# Patient Record
Sex: Male | Born: 1975 | Race: White | Hispanic: No | Marital: Married | Smoking: Never smoker
Health system: Southern US, Community
[De-identification: ages and names within clinical notes are randomized; demographics above are authoritative.]

## PROBLEM LIST (undated history)

## (undated) DIAGNOSIS — R42 Dizziness and giddiness: Secondary | ICD-10-CM

## (undated) HISTORY — DX: Dizziness and giddiness: R42

---

## 2015-11-02 ENCOUNTER — Ambulatory Visit (INDEPENDENT_AMBULATORY_CARE_PROVIDER_SITE_OTHER): Payer: BC Managed Care – PPO | Admitting: Family Medicine

## 2015-11-02 VITALS — BP 126/80 | HR 101 | Temp 98.3°F | Resp 18 | Ht 69.0 in | Wt 223.4 lb

## 2015-11-02 DIAGNOSIS — R42 Dizziness and giddiness: Secondary | ICD-10-CM

## 2015-11-02 DIAGNOSIS — R112 Nausea with vomiting, unspecified: Secondary | ICD-10-CM

## 2015-11-02 DIAGNOSIS — H9203 Otalgia, bilateral: Secondary | ICD-10-CM | POA: Diagnosis not present

## 2015-11-02 DIAGNOSIS — B349 Viral infection, unspecified: Secondary | ICD-10-CM

## 2015-11-02 DIAGNOSIS — D72829 Elevated white blood cell count, unspecified: Secondary | ICD-10-CM

## 2015-11-02 LAB — POCT CBC
GRANULOCYTE PERCENT: 79.4 % (ref 37–80)
HEMATOCRIT: 43 % — AB (ref 43.5–53.7)
HEMOGLOBIN: 15 g/dL (ref 14.1–18.1)
Lymph, poc: 2 (ref 0.6–3.4)
MCH, POC: 29.5 pg (ref 27–31.2)
MCHC: 34.9 g/dL (ref 31.8–35.4)
MCV: 84.6 fL (ref 80–97)
MID (cbc): 0.7 (ref 0–0.9)
MPV: 6.6 fL (ref 0–99.8)
PLATELET COUNT, POC: 247 10*3/uL (ref 142–424)
POC GRANULOCYTE: 10.5 — AB (ref 2–6.9)
POC LYMPH PERCENT: 15 %L (ref 10–50)
POC MID %: 5.6 %M (ref 0–12)
RBC: 5.09 M/uL (ref 4.69–6.13)
RDW, POC: 13.6 %
WBC: 13.2 10*3/uL — AB (ref 4.6–10.2)

## 2015-11-02 MED ORDER — DIAZEPAM 2 MG PO TABS: ORAL_TABLET | ORAL | Status: AC

## 2015-11-02 MED ORDER — ONDANSETRON 4 MG PO TBDP
4.0000 mg | ORAL_TABLET | Freq: Once | ORAL | Status: AC
  Administered 2015-11-02: 4 mg via ORAL

## 2015-11-02 MED ORDER — ONDANSETRON 4 MG PO TBDP: ORAL_TABLET | ORAL | Status: AC

## 2015-11-02 MED ORDER — AMOXICILLIN 875 MG PO TABS: ORAL_TABLET | ORAL | Status: AC

## 2015-11-02 NOTE — Progress Notes (Signed)
Patient ID: Don Lindsey, male    DOB: 07/31/1976  Age: 39 y.o. MRN: [id number]  Chief Complaint  Patient presents with  . Sore Throat    vomiting  . Cough  . Ear Pain    Subjective:   Patient is here with vertigo. He has an attack about every 6 years she said. Several weeks ago he had an attack and just treat it with his old motion exercises. He was doing okay. 3 days ago he got a flulike illness with fever and body aches. He had some respiratory symptoms. He is doing better from that. However today he was hit midday with vertigo which has been very bad. He feels like if he moves his head up or down at all he gets extremely dizzy. The room goes around. It causes problems for him to trying over in bed. He has been having vomiting. His ears feel full like they're going to explode. He gets some popping sensations at times. He denies the above noted sore throat, says he just feels achy. Wife and son accompanied him here today.  Current allergies, medications, problem list, past/family and social histories reviewed.  Objective:  BP 126/80 mmHg  Pulse 101  Temp(Src) 98.3 F (36.8 C) (Oral)  Resp 18  Ht 5\' 9"  (1.753 m)  Wt 223 lb 6.4 oz (101.334 kg)  BMI 32.98 kg/m2  SpO2 98%  Looks like he feels very bad. Very pale. TMs are mildly erythematous bilaterally but no landmark loss. His eyes PERRLA. Fundi benign. EOMs intact. No obvious nystagmus. He holds his head very rigid however. Neck is not rigid. Chest clear. Heart regular without murmurs. Abdomen nontender.  Assessment & Plan:   Assessment: 1. Otalgia of both ears   2. Vertigo   3. Non-intractable vomiting with nausea, unspecified vomiting type   4. Viral syndrome   5. Leukocytosis       Plan:  Orders Placed This Encounter  Procedures  . POCT CBC    Meds ordered this encounter  Medications  . ondansetron (ZOFRAN-ODT) disintegrating tablet 4 mg    Sig:   . amoxicillin (AMOXIL) 875 MG tablet    Sig: Take 1 twice daily for  ears    Dispense:  20 tablet    Refill:  0  . diazepam (VALIUM) 2 MG tablet    Sig: Take 1 every 6 hours as needed for dizziness    Dispense:  12 tablet    Refill:  0  . ondansetron (ZOFRAN ODT) 4 MG disintegrating tablet    Sig: Dissolve 1 under the tongue every 6 hours when needed for nausea    Dispense:  10 tablet    Refill:  0    Results for orders placed or performed in visit on 11/02/15  POCT CBC  Result Value Ref Range   WBC 13.2 (A) 4.6 - 10.2 K/uL   Lymph, poc 2.0 0.6 - 3.4   POC LYMPH PERCENT 15.0 10 - 50 %L   MID (cbc) 0.7 0 - 0.9   POC MID % 5.6 0 - 12 %M   POC Granulocyte 10.5 (A) 2 - 6.9   Granulocyte percent 79.4 37 - 80 %G   RBC 5.09 4.69 - 6.13 M/uL   Hemoglobin 15.0 14.1 - 18.1 g/dL   HCT, POC 46.943.0 (A) 62.943.5 - 53.7 %   MCV 84.6 80 - 97 fL   MCH, POC 29.5 27 - 31.2 pg   MCHC 34.9 31.8 - 35.4 g/dL   RDW,  POC 13.6 %   Platelet Count, POC 247 142 - 424 K/uL   MPV 6.6 0 - 99.8 fL        Patient Instructions  Take the diazepam 2 mg 1 every 6 hours as needed for dizziness  Take the ondansetron 4 mg 1 pill dissolved under the tongue every 6 hours as needed for nausea or vomiting  Take Tylenol 1000 mg 3 times daily or ibuprofen 800 mg 3 times daily as needed for pain  Take amoxicillin 825 mg 1 twice daily early ear infections  If acutely worse go to the emergency room.   If persisting symptoms or not improving over the next couple of days return as needed.     No Follow-up on file.   Farrell Pantaleo, MD 11/02/2015

## 2015-11-02 NOTE — Patient Instructions (Addendum)
Take the diazepam 2 mg 1 every 6 hours as needed for dizziness  Take the ondansetron 4 mg 1 pill dissolved under the tongue every 6 hours as needed for nausea or vomiting  Take Tylenol 1000 mg 3 times daily or ibuprofen 800 mg 3 times daily as needed for pain  Take amoxicillin 825 mg 1 twice daily early ear infections  If acutely worse go to the emergency room.   If persisting symptoms or not improving over the next couple of days return as needed.

## 2016-08-14 ENCOUNTER — Other Ambulatory Visit: Payer: Self-pay | Admitting: Otolaryngology

## 2016-08-15 ENCOUNTER — Other Ambulatory Visit: Payer: Self-pay | Admitting: Otolaryngology

## 2016-08-15 DIAGNOSIS — IMO0001 Reserved for inherently not codable concepts without codable children: Secondary | ICD-10-CM

## 2016-08-15 DIAGNOSIS — H918X2 Other specified hearing loss, left ear: Secondary | ICD-10-CM

## 2016-08-24 ENCOUNTER — Ambulatory Visit
Admission: RE | Admit: 2016-08-24 | Discharge: 2016-08-24 | Disposition: A | Discharge status: Home / Self Care | Payer: BC Managed Care – PPO | Source: Ambulatory Visit | Attending: Otolaryngology | Admitting: Otolaryngology

## 2016-08-24 DIAGNOSIS — H918X2 Other specified hearing loss, left ear: Secondary | ICD-10-CM

## 2016-08-24 DIAGNOSIS — IMO0001 Reserved for inherently not codable concepts without codable children: Secondary | ICD-10-CM

## 2016-08-24 MED ORDER — GADOBENATE DIMEGLUMINE 529 MG/ML IV SOLN
20.0000 mL | Freq: Once | INTRAVENOUS | Status: AC | PRN
  Administered 2016-08-24: 20 mL via INTRAVENOUS

## 2017-11-11 IMAGING — MR MR HEAD WO/W CM
10 of 11 series · 32 of 48 positions shown · IV contrast (multihance)
Comparison: None.

CLINICAL DATA: 40-year-old male with 6 months of left side hearing
loss and intermittent tinnitus. Intermittent nausea and headaches.
No known injury. Initial encounter.

EXAM:
MRI HEAD WITHOUT AND WITH CONTRAST
TECHNIQUE: Multiplanar, multiecho pulse sequences of the brain and surrounding
structures were obtained without and with intravenous contrast.
CONTRAST:  20mL MULTIHANCE GADOBENATE DIMEGLUMINE 529 MG/ML IV SOLN

[Series 2: T1 · sagittal · 5.0mm · 0.47mm/px · 2 of 19 slices shown (1 of 4)]
[im 1/19]
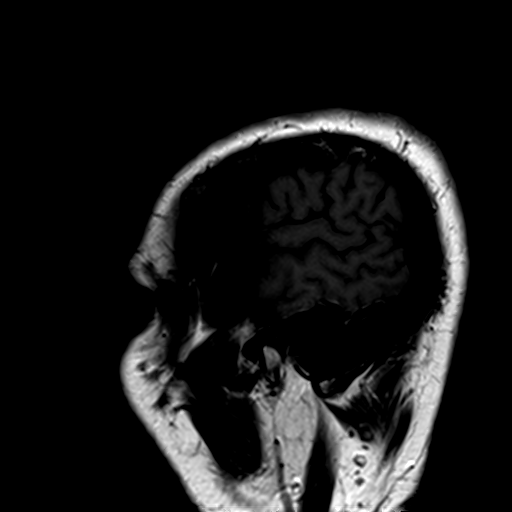
[im 19/19]
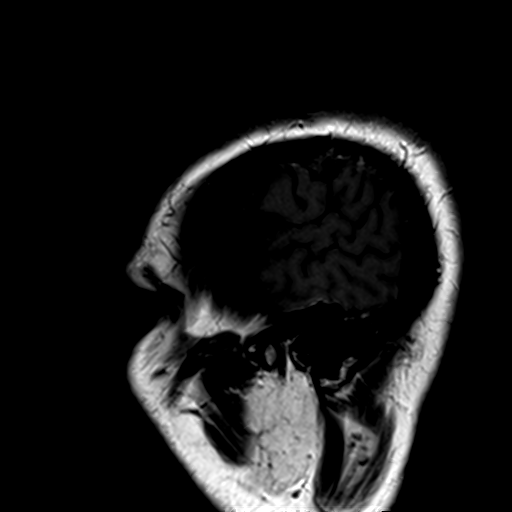

[Series 3: ep2d_diff_(id)_trace · axial · 3.0mm · 1.80mm/px · z∈[-22,+137]mm · 8 of 105 slices shown]
[im 1/105]
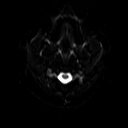
[im 18/105]
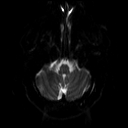
[im 35/105]
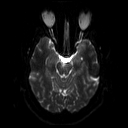
[im 44/105]
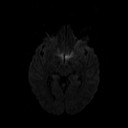
[im 61/105]
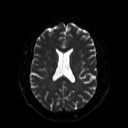
[im 70/105]
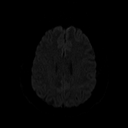
[im 87/105]
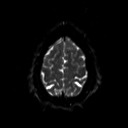
[im 105/105]
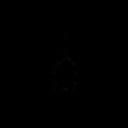

[Series 4: ep2d_diff_(id)_trace_adc · axial · 3.0mm · 1.80mm/px · z∈[-22,+137]mm · 6 of 54 slices shown]
[im 1/54]
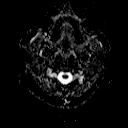
[im 11/54]
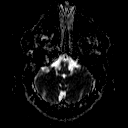
[im 22/54]
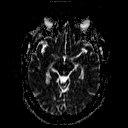
[im 32/54]
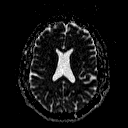
[im 43/54]
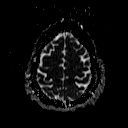
[im 54/54]
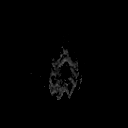

[Series 5: T2 · axial · 5.0mm · 0.45mm/px · z∈[-21,+135]mm · 3 of 26 slices shown]
[im 1/26]
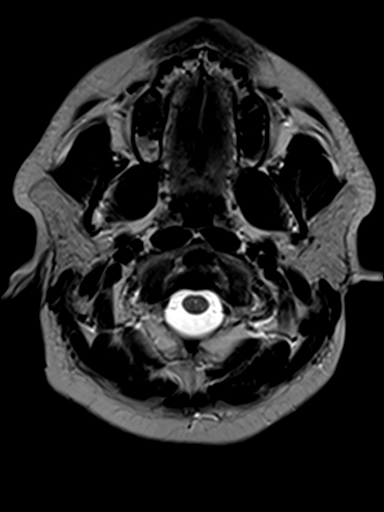
[im 13/26]
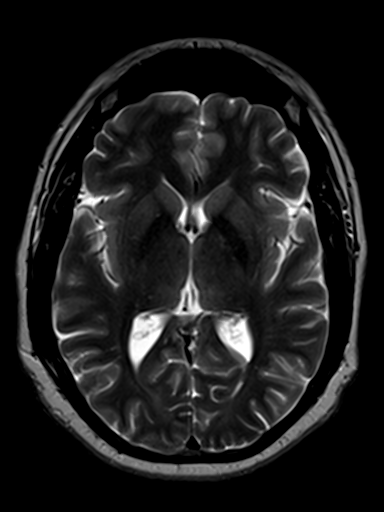
[im 26/26]
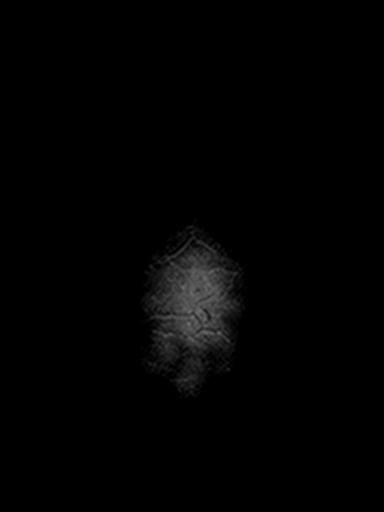

[Series 6: FLAIR · axial · 5.0mm · 0.45mm/px · z∈[-21,+136]mm · 3 of 26 slices shown]
[im 1/26]
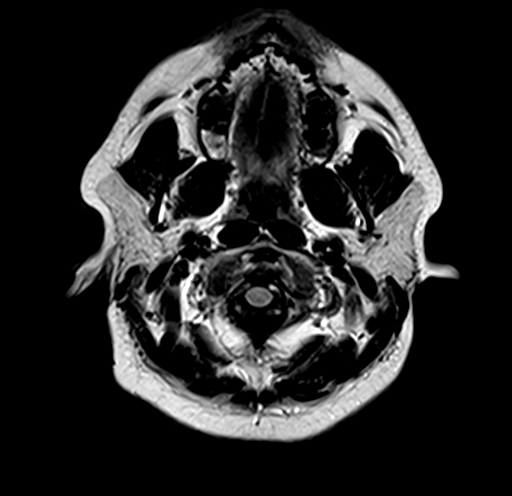
[im 13/26]
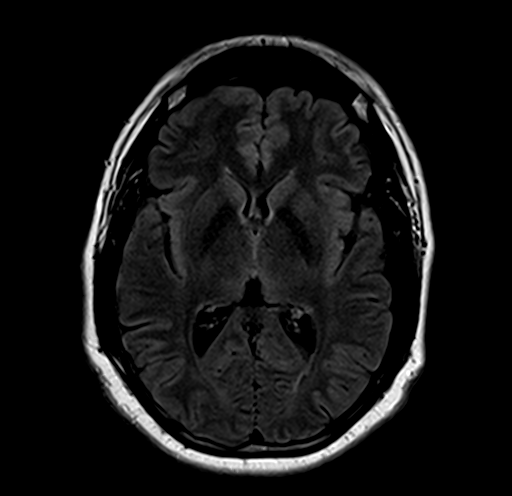
[im 26/26]
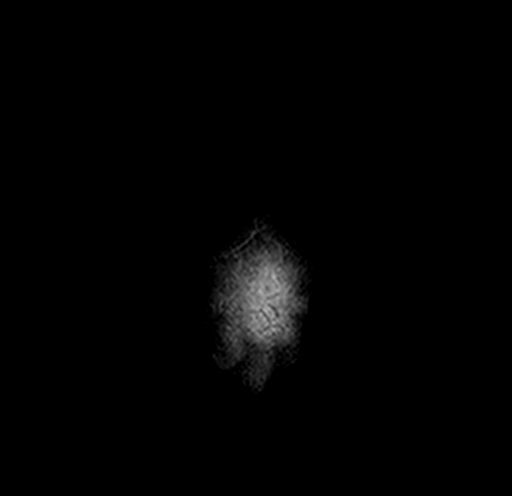

[Series 7: T1 · coronal · 3.0mm · 0.35mm/px · 1 of 11 slices shown (2 of 4)]
[im 1/11]
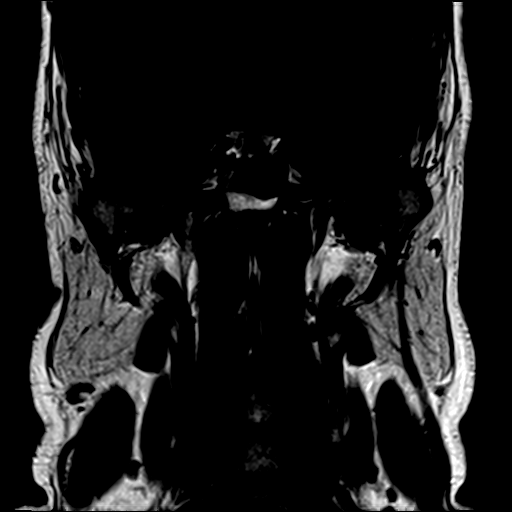

[Series 8: T1 · axial · 3.0mm · 0.35mm/px · z∈[-7,+34]mm · 2 of 14 slices shown (3 of 4)]
[im 1/14]
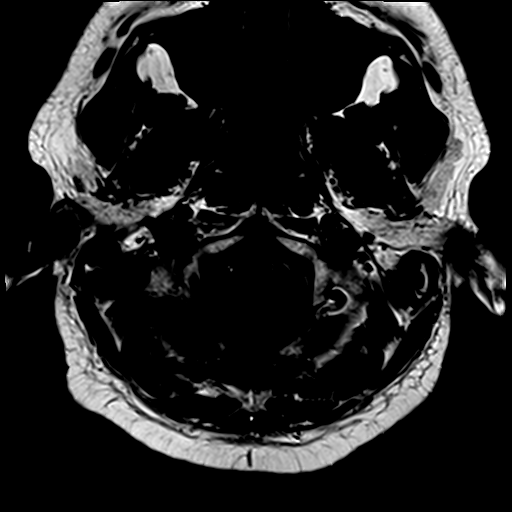
[im 14/14]
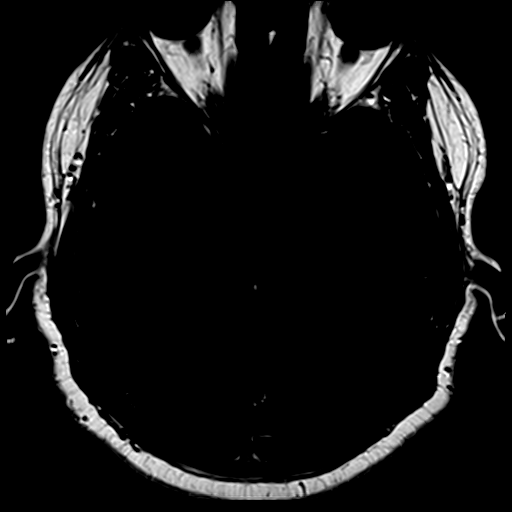

[Series 9: bSSFP · axial · 0.7mm · 0.30mm/px · z∈[-3,+17]mm · 4 of 48 slices shown]
[im 1/48]
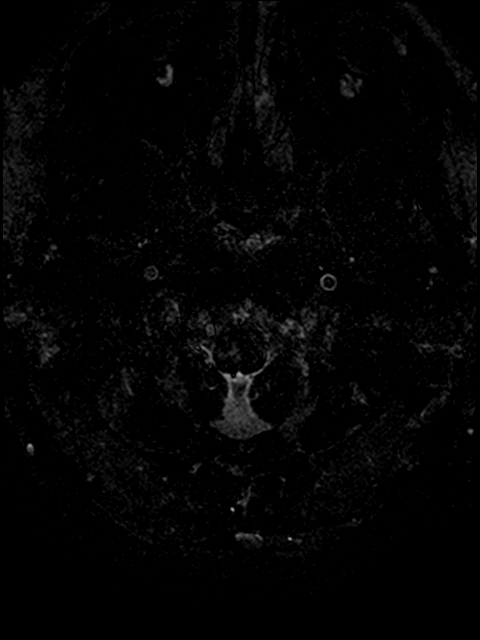
[im 10/48]
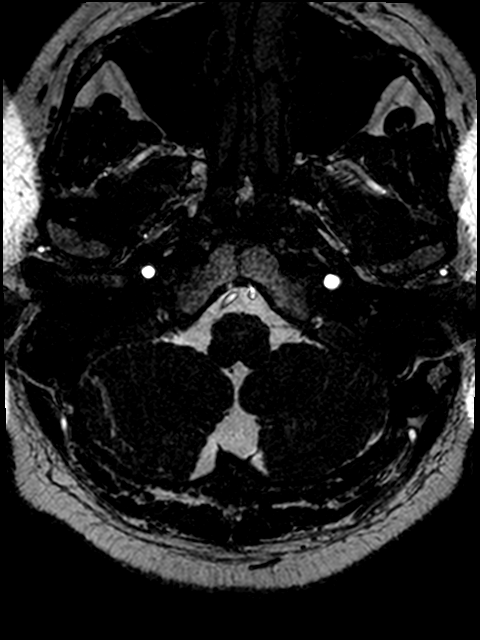
[im 19/48]
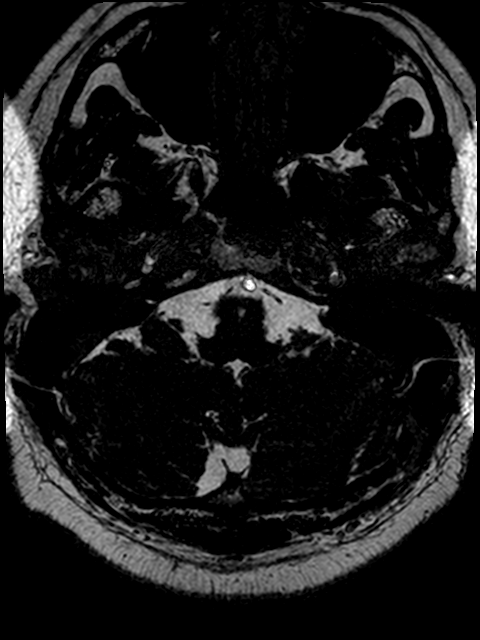
[im 29/48]
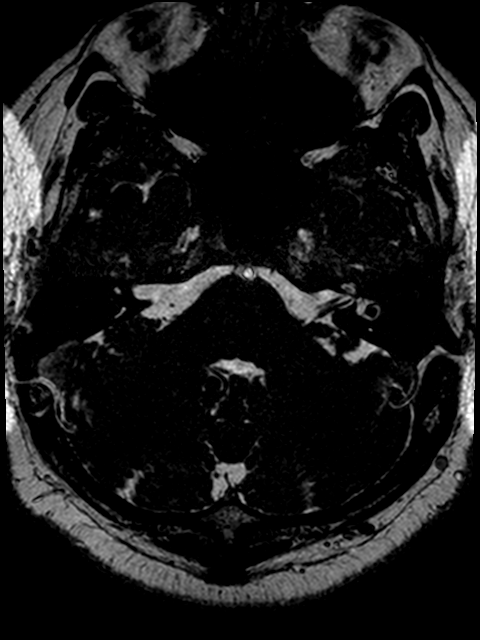

[Series 10: T1 · coronal · 3.0mm · 0.35mm/px · 1 of 11 slices shown (4 of 4)]
[im 1/11]
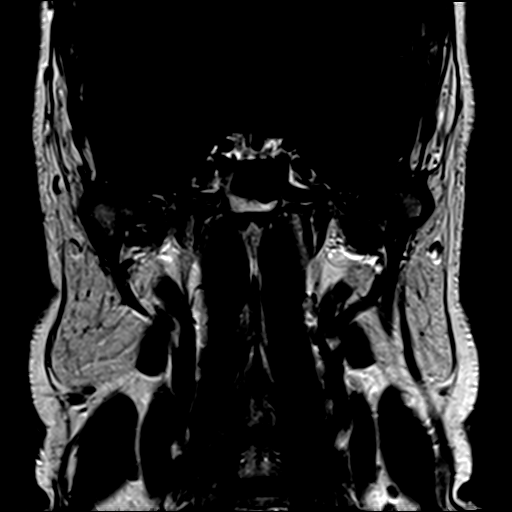

[Series 11: T1 post-contrast · axial · 3.0mm · 0.35mm/px · z∈[-7,+34]mm · 2 of 14 slices shown]
[im 1/14]
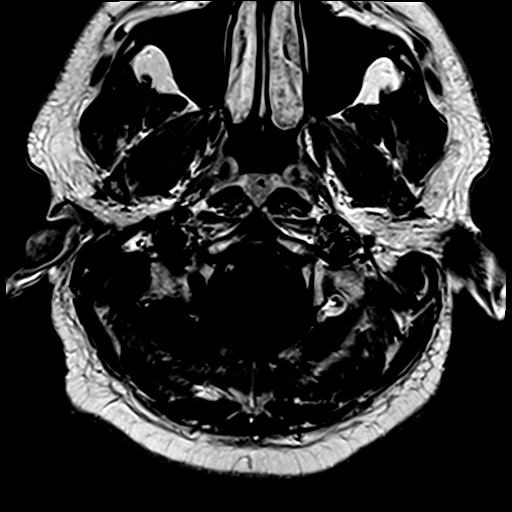
[im 14/14]
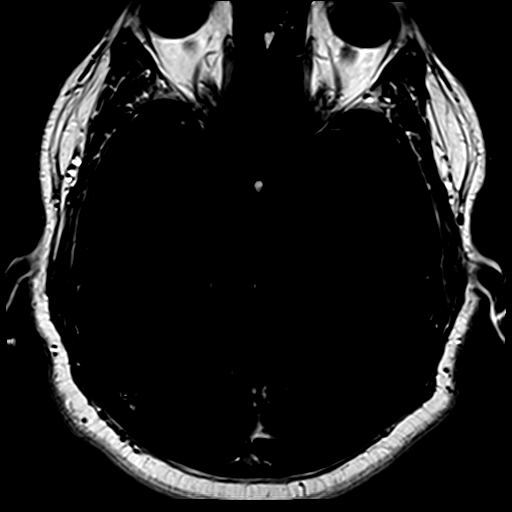

[32 of 48 positions shown; findings below may reference images not displayed]

FINDINGS: No restricted diffusion to suggest acute infarction. No midline
shift, mass effect, evidence of mass lesion, ventriculomegaly,
extra-axial collection or acute intracranial hemorrhage.
Cervicomedullary junction and pituitary are within normal limits.
Negative visualized cervical spine. Major intracranial vascular flow
voids appear normal. Gray and white matter signal is within normal
limits throughout the brain. No abnormal enhancement identified.

Orbit and scalp soft tissues appear normal. Normal bone marrow
signal. Paranasal sinuses are clear.

Dedicated internal auditory canal imaging. Normal cerebellopontine
angles. Normal bilateral cisternal and intracanalicular 7th and 8th
cranial nerve segments. Preserved signal in the bilateral cochlea
and vestibular structures. No abnormal enhancement identified.
Normal stylomastoid foramina. Normal visualized parotid glands. No
skullbase abnormality identified.
IMPRESSION: 1. Negative IAC imaging.
2.  Normal MRI appearance of the brain.
